# Patient Record
Sex: Male | Born: 1975 | Race: Black or African American | Hispanic: No | Marital: Married | Smoking: Former smoker
Health system: Southern US, Community
[De-identification: ages and names within clinical notes are randomized; demographics above are authoritative.]

---

## 2020-12-11 ENCOUNTER — Other Ambulatory Visit: Payer: Self-pay

## 2020-12-11 ENCOUNTER — Inpatient Hospital Stay: Payer: Self-pay | Attending: Hematology

## 2020-12-11 DIAGNOSIS — Z23 Encounter for immunization: Secondary | ICD-10-CM | POA: Insufficient documentation

## 2020-12-11 NOTE — Progress Notes (Signed)
   Covid-19 Vaccination Clinic  Name:  Gavin Santos    MRN: [id number] DOB: 09-Dec-1975  12/11/2020  [name] was observed post Covid-19 immunization for 15 minutes without incident. He was provided with Vaccine Information Sheet and instruction to access the V-Safe system.   [name] was instructed to call 911 with any severe reactions post vaccine: Marland Kitchen Difficulty breathing  . Swelling of face and throat  . A fast heartbeat  . A bad rash all over body  . Dizziness and weakness   Immunizations Administered    Name Date Dose VIS Date Route   Pfizer COVID-19 Vaccine 12/11/2020  3:10 PM 0.3 mL 09/17/2020 Intramuscular   Manufacturer: [employer], [employer]   Lot: [id number]   NDC: [address]-5

## 2021-04-16 ENCOUNTER — Other Ambulatory Visit: Payer: Self-pay

## 2021-04-16 ENCOUNTER — Other Ambulatory Visit (HOSPITAL_BASED_OUTPATIENT_CLINIC_OR_DEPARTMENT_OTHER): Payer: Self-pay

## 2021-04-16 ENCOUNTER — Encounter (HOSPITAL_COMMUNITY): Payer: Self-pay

## 2021-04-16 ENCOUNTER — Telehealth (HOSPITAL_COMMUNITY): Payer: Self-pay | Admitting: Emergency Medicine

## 2021-04-16 ENCOUNTER — Ambulatory Visit (HOSPITAL_COMMUNITY)
Admission: RE | Admit: 2021-04-16 | Discharge: 2021-04-16 | Disposition: A | Discharge status: Home / Self Care | Payer: No Typology Code available for payment source | Source: Ambulatory Visit | Attending: Internal Medicine | Admitting: Internal Medicine

## 2021-04-16 VITALS — BP 129/91 | HR 78 | Temp 99.8°F | Resp 20

## 2021-04-16 DIAGNOSIS — M5432 Sciatica, left side: Secondary | ICD-10-CM

## 2021-04-16 MED ORDER — METHOCARBAMOL 500 MG PO TABS
500.0000 mg | ORAL_TABLET | Freq: Three times a day (TID) | ORAL | 0 refills | Status: DC | PRN
  Filled 2021-04-16 (×2): qty 30, 10d supply, fill #0

## 2021-04-16 MED ORDER — METHOCARBAMOL 500 MG PO TABS: 500.0000 mg | ORAL_TABLET | Freq: Three times a day (TID) | ORAL | 0 refills | Status: DC | PRN

## 2021-04-16 MED ORDER — KETOROLAC TROMETHAMINE 60 MG/2ML IM SOLN
INTRAMUSCULAR | Status: AC
  Filled 2021-04-16: qty 2

## 2021-04-16 MED ORDER — IBUPROFEN 800 MG PO TABS
800.0000 mg | ORAL_TABLET | Freq: Three times a day (TID) | ORAL | 0 refills | Status: DC | PRN
  Filled 2021-04-16 (×2): qty 30, 10d supply, fill #0

## 2021-04-16 MED ORDER — PREDNISONE 20 MG PO TABS
40.0000 mg | ORAL_TABLET | Freq: Every day | ORAL | 0 refills | Status: AC
  Filled 2021-04-16 (×2): qty 14, 7d supply, fill #0

## 2021-04-16 MED ORDER — PREDNISONE 20 MG PO TABS: 40.0000 mg | ORAL_TABLET | Freq: Every day | ORAL | 0 refills | Status: DC

## 2021-04-16 MED ORDER — KETOROLAC TROMETHAMINE 60 MG/2ML IM SOLN
60.0000 mg | Freq: Once | INTRAMUSCULAR | Status: AC
  Administered 2021-04-16: 60 mg via INTRAMUSCULAR

## 2021-04-16 MED ORDER — IBUPROFEN 800 MG PO TABS: 800.0000 mg | ORAL_TABLET | Freq: Three times a day (TID) | ORAL | 0 refills | Status: DC | PRN

## 2021-04-16 NOTE — Discharge Instructions (Addendum)
Take medication as prescribed.  Do not start the ibuprofen until tomorrow as you received an injection at the clinic today.  The muscle relaxer may make you tired so no driving when taking. Heating pad and light stretches as we discussed.

## 2021-04-16 NOTE — ED Triage Notes (Signed)
Pt in with c/o left side sciatic nerve pain x 2 weeks  Pt states he has been doing stretches with no relief

## 2021-04-16 NOTE — ED Provider Notes (Signed)
[facility]    CSN: [id number] Arrival date & time: 04/16/21  1240      History   Chief Complaint Chief Complaint  Patient presents with  . sciatic nerve pain  . Appointment    HPI Gavin Santos is a 45 y.o. male presents urgent care with complaints of left leg pain x2 weeks.  Patient states pain begins in mid glutes and radiates down back of leg to the knee.  Symptoms similar to previous sciatica flares in the past.  He has tried gabapentin and Aleve with little relief.  He denies any weakness, trauma, injury, numbness or tingling.  Pain exacerbated by sitting for long periods of time.  History reviewed. No pertinent past medical history.  There are no problems to display for this patient.   History reviewed. No pertinent surgical history.     Home Medications    Prior to Admission medications   Medication Sig Start Date End Date Taking? Authorizing Provider  ibuprofen (ADVIL) 800 MG tablet Take 1 tablet (800 mg total) by mouth every 8 (eight) hours as needed for moderate pain. 04/16/21  Yes Rolla Etienne, NP  methocarbamol (ROBAXIN) 500 MG tablet Take 1 tablet (500 mg total) by mouth every 8 (eight) hours as needed for muscle spasms. 04/16/21  Yes Rolla Etienne, NP  predniSONE (DELTASONE) 20 MG tablet Take 2 tablets (40 mg total) by mouth daily with breakfast for 7 days. 04/16/21 04/23/21 Yes Rolla Etienne, NP    Family History History reviewed. No pertinent family history.  Social History Social History   Tobacco Use  . Smoking status: Former Games developer  . Smokeless tobacco: Never Used  Substance Use Topics  . Drug use: Never     Allergies   Mixed ragweed and Penicillins   Review of Systems Stated in HPI otherwise negative  Physical Exam Triage Vital Signs ED Triage Vitals  Enc Vitals Group     BP 04/16/21 1255 (!) 129/91     Pulse Rate 04/16/21 1255 78     Resp 04/16/21 1255 20     Temp 04/16/21 1255 99.8 F (37.7 C)     Temp Source  04/16/21 1255 Oral     SpO2 04/16/21 1255 98 %     Weight --      Height --      Head Circumference --      Peak Flow --      Pain Score 04/16/21 1251 3     Pain Loc --      Pain Edu? --      Excl. in GC? --    No data found.  Updated Vital Signs BP (!) 129/91 (BP Location: Right Arm)   Pulse 78   Temp 99.8 F (37.7 C) (Oral)   Resp 20   SpO2 98%   Visual Acuity Right Eye Distance:   Left Eye Distance:   Bilateral Distance:    Right Eye Near:   Left Eye Near:    Bilateral Near:     Physical Exam Constitutional:      General: He is not in acute distress.    Appearance: Normal appearance. He is not ill-appearing or toxic-appearing.  Musculoskeletal:        General: No deformity. Normal range of motion.  Skin:    General: Skin is warm and dry.  Neurological:     General: No focal deficit present.     Mental Status: He is alert and oriented to  person, place, and time.     Sensory: No sensory deficit.     Motor: No weakness.     Gait: Gait normal.     Comments: Pain elicited with straight leg test  Psychiatric:        Mood and Affect: Mood normal.        Behavior: Behavior normal.      UC Treatments / Results  Labs (all labs ordered are listed, but only abnormal results are displayed) Labs Reviewed - No data to display  EKG   Radiology No results found.  Procedures Procedures (including critical care time)  Medications Ordered in UC Medications  ketorolac (TORADOL) injection 60 mg (has no administration in time range)    Initial Impression / Assessment and Plan / UC Course  I have reviewed the triage vital signs and the nursing notes.  Pertinent labs & imaging results that were available during my care of the patient were reviewed by me and considered in my medical decision making (see chart for details).  Sciatica, left side -Pain is 2 weeks difficulty sleeping and performing job -Short prednisone burst -IM Toradol in office.  To begin  ibuprofen as needed tomorrow, Robaxin as needed, heat, light stretches  Reviewed expections re: course of current medical issues. Questions answered. Outlined signs and symptoms indicating need for more acute intervention. Pt verbalized understanding. AVS given   Final Clinical Impressions(s) / UC Diagnoses   Final diagnoses:  Sciatica of left side     Discharge Instructions     Take medication as prescribed. The muscle relaxer may make you tired so no driving when taking. Heating pad and light stretches as we discussed.     ED Prescriptions    Medication Sig Dispense Auth. Provider   predniSONE (DELTASONE) 20 MG tablet Take 2 tablets (40 mg total) by mouth daily with breakfast for 7 days. 14 tablet Rolla Etienne, NP   ibuprofen (ADVIL) 800 MG tablet Take 1 tablet (800 mg total) by mouth every 8 (eight) hours as needed for moderate pain. 30 tablet Rolla Etienne, NP   methocarbamol (ROBAXIN) 500 MG tablet Take 1 tablet (500 mg total) by mouth every 8 (eight) hours as needed for muscle spasms. 30 tablet Rolla Etienne, NP     PDMP not reviewed this encounter.   Rolla Etienne, NP 04/16/21 1351

## 2021-06-11 ENCOUNTER — Other Ambulatory Visit: Payer: Self-pay

## 2021-06-12 ENCOUNTER — Encounter: Payer: Self-pay | Admitting: Medical

## 2021-06-12 ENCOUNTER — Telehealth: Payer: Self-pay | Admitting: Medical

## 2021-06-12 ENCOUNTER — Ambulatory Visit (HOSPITAL_BASED_OUTPATIENT_CLINIC_OR_DEPARTMENT_OTHER)
Admission: RE | Admit: 2021-06-12 | Discharge: 2021-06-12 | Disposition: A | Discharge status: Home / Self Care | Payer: No Typology Code available for payment source | Source: Ambulatory Visit | Attending: Medical | Admitting: Medical

## 2021-06-12 ENCOUNTER — Ambulatory Visit (INDEPENDENT_AMBULATORY_CARE_PROVIDER_SITE_OTHER): Payer: No Typology Code available for payment source | Admitting: Medical

## 2021-06-12 VITALS — BP 120/80 | HR 62 | Temp 98.4°F | Resp 18 | Ht 74.0 in | Wt 320.0 lb

## 2021-06-12 DIAGNOSIS — G8929 Other chronic pain: Secondary | ICD-10-CM

## 2021-06-12 DIAGNOSIS — Z23 Encounter for immunization: Secondary | ICD-10-CM

## 2021-06-12 DIAGNOSIS — M5442 Lumbago with sciatica, left side: Secondary | ICD-10-CM

## 2021-06-12 DIAGNOSIS — Z1211 Encounter for screening for malignant neoplasm of colon: Secondary | ICD-10-CM | POA: Diagnosis not present

## 2021-06-12 DIAGNOSIS — Z113 Encounter for screening for infections with a predominantly sexual mode of transmission: Secondary | ICD-10-CM

## 2021-06-12 DIAGNOSIS — Z0000 Encounter for general adult medical examination without abnormal findings: Secondary | ICD-10-CM

## 2021-06-12 NOTE — Addendum Note (Signed)
Addended by: Maximino Sarin on: 06/12/2021 03:10 PM   Modules accepted: Orders

## 2021-06-12 NOTE — Patient Instructions (Addendum)
For you wellness exam today I have ordered cbc, cmp, lipid panel and hiv.  Vaccine given today tdap.  Recommend exercise and healthy diet.  We will let you know lab results as they come in.  Follow up date appointment will be determined after lab review.    For back pain/sciatica persisting after 2 month will get xray lumbar spine. After review may refer to sports medicine MD.  Preventive Care 45-45 Years Old, Male Preventive care refers to lifestyle choices and visits with your health care provider that can promote health and wellness. This includes: A yearly physical exam. This is also called an annual wellness visit. Regular dental and eye exams. Immunizations. Screening for certain conditions. Healthy lifestyle choices, such as: Eating a healthy diet. Getting regular exercise. Not using drugs or products that contain nicotine and tobacco. Limiting alcohol use. What can I expect for my preventive care visit? Physical exam Your health care provider may check your: Height and weight. These may be used to calculate your BMI (body mass index). BMI is a measurement that tells if you are at a healthy weight. Heart rate and blood pressure. Body temperature. Skin for abnormal spots. Counseling Your health care provider may ask you questions about your: Past medical problems. Family's medical history. Alcohol, tobacco, and drug use. Emotional well-being. Home life and relationship well-being. Sexual activity. Diet, exercise, and sleep habits. Work and work Astronomer. Access to firearms. What immunizations do I need?  Vaccines are usually given at various ages, according to a schedule. Your health care provider will recommend vaccines for you based on your age, medicalhistory, and lifestyle or other factors, such as travel or where you work. What tests do I need? Blood tests Lipid and cholesterol levels. These may be checked every 5 years starting at age 76. Hepatitis C  test. Hepatitis B test. Screening  Diabetes screening. This is done by checking your blood sugar (glucose) after you have not eaten for a while (fasting). Genital exam to check for testicular cancer or hernias. STD (sexually transmitted disease) testing, if you are at risk. Talk with your health care provider about your test results, treatment options,and if necessary, the need for more tests. Follow these instructions at home: Eating and drinking  Eat a healthy diet that includes fresh fruits and vegetables, whole grains, lean protein, and low-fat dairy products. Drink enough fluid to keep your urine pale yellow. Take vitamin and mineral supplements as recommended by your health care provider. Do not drink alcohol if your health care provider tells you not to drink. If you drink alcohol: Limit how much you have to 0-2 drinks a day. Be aware of how much alcohol is in your drink. In the [place] one drink equals one 12 oz bottle of beer (355 mL), one 5 oz glass of wine (148 mL), or one 1 oz glass of hard liquor (44 mL).  Lifestyle Take daily care of your teeth and gums. Brush your teeth every morning and night with fluoride toothpaste. Floss one time each day. Stay active. Exercise for at least 30 minutes 5 or more days each week. Do not use any products that contain nicotine or tobacco, such as cigarettes, e-cigarettes, and chewing tobacco. If you need help quitting, ask your health care provider. Do not use drugs. If you are sexually active, practice safe sex. Use a condom or other form of protection to prevent STIs (sexually transmitted infections). Find healthy ways to cope with stress, such as: Meditation, yoga, or listening  to music. Journaling. Talking to a trusted person. Spending time with friends and family. Safety Always wear your seat belt while driving or riding in a vehicle. Do not drive: If you have been drinking alcohol. Do not ride with someone who has been  drinking. When you are tired or distracted. While texting. Wear a helmet and other protective equipment during sports activities. If you have firearms in your house, make sure you follow all gun safety procedures. Seek help if you have been physically or sexually abused. What's next? Go to your health care provider once a year for an annual wellness visit. Ask your health care provider how often you should have your eyes and teeth checked. Stay up to date on all vaccines. This information is not intended to replace advice given to you by your health care provider. Make sure you discuss any questions you have with your healthcare provider. Document Revised: 08/01/2019 Document Reviewed: 11/09/2018 Elsevier Patient Education  2022 [employer].

## 2021-06-12 NOTE — Telephone Encounter (Signed)
Sport med referral placed

## 2021-06-12 NOTE — Progress Notes (Signed)
Subjective:    Patient ID: Gavin Santos, male    DOB: 04-Nov-1976, 45 y.o.   MRN: [id number]  HPI  Pt in for first time.  Decide to go ahead and do wellness exam since new pt and not complicated.  Pt up to date on covid vaccine. Had one j and j vaccine and got phizer as booster.   Needs tetanus not up to date.  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 Pt does not exercise reguarly. States diet is ok. Eats fast food 2-3 times a week. Non smoker. Alcohol use. Sporadic use. Every 3 months 3-4 shots tequilla or bourbon. Pepsi 2-3 cans a day. 1 cup in morning.  No known chronic medical problems. More than 3 years since last cpe.  Pt mentions some recent sciatica.   May 19,2022 seen in the ED.  Hpi in [name] is a 45 y.o. male presents urgent care with complaints of left leg pain x2 weeks.  Patient states pain begins in mid glutes and radiates down back of leg to the knee.  Symptoms similar to previous sciatica flares in the past.  He has tried gabapentin and Aleve with little relief.  He denies any weakness, trauma, injury, numbness or tingling.  Pain exacerbated by sitting for long periods of time  A/P in ED.  "-Pain is 2 weeks difficulty sleeping and performing job -Short prednisone burst -IM Toradol in office.  To begin ibuprofen as needed tomorrow, Robaxin as needed, heat, light stretches   Reviewed expections re: course of current medical issues. Questions answered. Outlined signs and symptoms indicating need for more acute intervention. Pt verbalized understanding. AVS given"  No pain is present only at the end of the day. But still present faintly. Not on any med presently. Now pain level 6/10 after work. If go home and sits pain will dissipate but gradualy.   No xray done in the UC.    Review of Systems  Constitutional:  Negative for chills, fatigue and fever.  HENT:  Negative for dental problem, ear pain, mouth sores and nosebleeds.    Respiratory:  Negative for cough, chest tightness, wheezing and stridor.   Cardiovascular:  Negative for chest pain and palpitations.  Gastrointestinal:  Negative for abdominal pain.  Genitourinary:  Negative for dysuria and enuresis.  Musculoskeletal:  Negative for back pain.  Skin:  Negative for rash.  Neurological:  Negative for dizziness, syncope, light-headedness and headaches.  Hematological:  Negative for adenopathy. Does not bruise/bleed easily.  Psychiatric/Behavioral:  Negative for confusion.    History reviewed. No pertinent past medical history.   Social History   Socioeconomic History   Marital status: Married    Spouse name: Not on file   Number of children: Not on file   Years of education: Not on file   Highest education level: Not on file  Occupational History   Not on file  Tobacco Use   Smoking status: Former   Smokeless tobacco: Never  Vaping Use   Vaping Use: Never used  Substance and Sexual Activity   Alcohol use: Yes    Alcohol/week: 3.0 standard drinks    Types: 3 Shots of liquor per week    Comment: sporatic use about every 3 months.   Drug use: Never   Sexual activity: Yes  Other Topics Concern   Not on file  Social History Narrative   Not on file   Social Determinants of Health   Financial Resource Strain: Not on  file  Food Insecurity: Not on file  Transportation Needs: Not on file  Physical Activity: Not on file  Stress: Not on file  Social Connections: Not on file  Intimate Partner Violence: Not on file    History reviewed. No pertinent surgical history.  History reviewed. No pertinent family history.  Allergies  Allergen Reactions   Mixed Ragweed    Penicillins     Current Outpatient Medications on File Prior to Visit  Medication Sig Dispense Refill   ibuprofen (ADVIL) 800 MG tablet Take 1 tablet (800 mg total) by mouth every 8 (eight) hours as needed for moderate pain. 30 tablet 0   methocarbamol (ROBAXIN) 500 MG tablet  Take 1 tablet (500 mg total) by mouth every 8 (eight) hours as needed for muscle spasms. 30 tablet 0   No current facility-administered medications on file prior to visit.    Pulse 62   Temp 98.4 F (36.9 C)   Resp 18   Ht 6\' 2"  (1.88 m)   SpO2 98%         Objective:   Physical Exam  General Appearance- Not in acute distress.    Chest and Lung Exam Auscultation: Breath sounds:-Normal. Clear even and unlabored. Adventitious sounds:- No Adventitious sounds.  Cardiovascular Auscultation:Rythm - Regular, rate and rythm. Heart Sounds -Normal heart sounds.  Abdomen Inspection:-Inspection Normal.  Palpation/Perucssion: Palpation and Percussion of the abdomen reveal- Non Tender, No Rebound tenderness, No rigidity(Guarding) and No Palpable abdominal masses.  Liver:-Normal.  Spleen:- Normal.   Back Mid lumbar spine tenderness to palpation. Pain on straight leg lift. Pain on lateral movements and flexion/extension of the spine.  Lower ext neurologic  L5-S1 sensation intact bilaterally. Normal patellar reflexes bilaterally. No foot drop bilaterally.       Assessment & Plan:   For you wellness exam today I have ordered cbc, cmp, lipid panel and hiv.  Vaccine given today tdap.  Recommend exercise and healthy diet.  We will let you know lab results as they come in.  Follow up date appointment will be determined after lab review.    For back pain/sciatica persisting after 2 month will get xray lumbar spine. After review may refer to sports medicine MD.  , PA-C

## 2021-06-15 LAB — LIPID PANEL
Cholesterol: 183 mg/dL (ref <200)
HDL: 43 mg/dL (ref >=40)
LDL Cholesterol (Calc): 119 mg/dL (calc) — ABNORMAL HIGH
Non-HDL Cholesterol (Calc): 140 mg/dL (calc) — ABNORMAL HIGH (ref <130)
Total CHOL/HDL Ratio: 4.3 (calc) (ref <5.0)
Triglycerides: 99 mg/dL (ref <150)

## 2021-06-15 LAB — CBC WITH DIFFERENTIAL/PLATELET
Absolute Monocytes: 312 cells/uL (ref 200–950)
Basophils Absolute: 49 cells/uL (ref 0–200)
Basophils Relative: 1.3 %
Eosinophils Absolute: 30 cells/uL (ref 15–500)
Eosinophils Relative: 0.8 %
HCT: 46 % (ref 38.5–50.0)
Hemoglobin: 15.1 g/dL (ref 13.2–17.1)
Lymphs Abs: 1710 cells/uL (ref 850–3900)
MCH: 27.9 pg (ref 27.0–33.0)
MCHC: 32.8 g/dL (ref 32.0–36.0)
MCV: 85 fL (ref 80.0–100.0)
MPV: 10.2 fL (ref 7.5–12.5)
Monocytes Relative: 8.2 %
Neutro Abs: 1699 cells/uL (ref 1500–7800)
Neutrophils Relative %: 44.7 %
Platelets: 238 10*3/uL (ref 140–400)
RBC: 5.41 10*6/uL (ref 4.20–5.80)
RDW: 13.1 % (ref 11.0–15.0)
Total Lymphocyte: 45 %
WBC: 3.8 10*3/uL (ref 3.8–10.8)

## 2021-06-15 LAB — COMPREHENSIVE METABOLIC PANEL
AG Ratio: 1.6 (calc) (ref 1.0–2.5)
ALT: 15 U/L (ref 9–46)
AST: 19 U/L (ref 10–40)
Albumin: 4.3 g/dL (ref 3.6–5.1)
Alkaline phosphatase (APISO): 45 U/L (ref 36–130)
BUN: 14 mg/dL (ref 7–25)
CO2: 24 mmol/L (ref 20–32)
Calcium: 9.4 mg/dL (ref 8.6–10.3)
Chloride: 103 mmol/L (ref 98–110)
Creat: 1.19 mg/dL (ref 0.60–1.29)
Globulin: 2.7 g/dL (calc) (ref 1.9–3.7)
Glucose, Bld: 87 mg/dL (ref 65–99)
Potassium: 4.4 mmol/L (ref 3.5–5.3)
Sodium: 139 mmol/L (ref 135–146)
Total Bilirubin: 0.6 mg/dL (ref 0.2–1.2)
Total Protein: 7 g/dL (ref 6.1–8.1)

## 2021-06-15 LAB — HIV ANTIBODY (ROUTINE TESTING W REFLEX): HIV 1&2 Ab, 4th Generation: NONREACTIVE

## 2021-06-26 ENCOUNTER — Ambulatory Visit (HOSPITAL_BASED_OUTPATIENT_CLINIC_OR_DEPARTMENT_OTHER)
Admission: RE | Admit: 2021-06-26 | Discharge: 2021-06-26 | Disposition: A | Discharge status: Home / Self Care | Payer: No Typology Code available for payment source | Source: Ambulatory Visit | Attending: Family Medicine | Admitting: Family Medicine

## 2021-06-26 ENCOUNTER — Other Ambulatory Visit: Payer: Self-pay

## 2021-06-26 ENCOUNTER — Ambulatory Visit: Payer: Self-pay

## 2021-06-26 ENCOUNTER — Other Ambulatory Visit (HOSPITAL_BASED_OUTPATIENT_CLINIC_OR_DEPARTMENT_OTHER): Payer: Self-pay

## 2021-06-26 ENCOUNTER — Ambulatory Visit (INDEPENDENT_AMBULATORY_CARE_PROVIDER_SITE_OTHER): Payer: No Typology Code available for payment source | Admitting: Family Medicine

## 2021-06-26 ENCOUNTER — Encounter: Payer: Self-pay | Admitting: Family Medicine

## 2021-06-26 VITALS — BP 140/108 | Ht 74.0 in | Wt 320.0 lb

## 2021-06-26 DIAGNOSIS — M659 Synovitis and tenosynovitis, unspecified: Secondary | ICD-10-CM | POA: Diagnosis not present

## 2021-06-26 DIAGNOSIS — M461 Sacroiliitis, not elsewhere classified: Secondary | ICD-10-CM | POA: Insufficient documentation

## 2021-06-26 DIAGNOSIS — M79675 Pain in left toe(s): Secondary | ICD-10-CM

## 2021-06-26 MED ORDER — CELECOXIB 200 MG PO CAPS
ORAL_CAPSULE | ORAL | 2 refills | Status: AC
  Filled 2021-06-26: qty 60, 30d supply, fill #0
  Filled 2021-07-30: qty 60, 30d supply, fill #1
  Filled 2021-09-27: qty 60, 30d supply, fill #2

## 2021-06-26 NOTE — Patient Instructions (Signed)
Nice to meet you Please try heat Please try the medicine as needed  I will call with the results from today   Please send me a message in MyChart with any questions or updates.  Please see me back in 4 weeks.   --Dr. Jordan Likes

## 2021-06-26 NOTE — Assessment & Plan Note (Signed)
Having acute on chronic low back pain with radicular type pain.  Has changes in the SI joints with could represent ankylosing spondylitis. -Counseled on home exercise therapy and supportive care. -Celebrex. -ANA panel, CRP, sed rate, HLA-B27

## 2021-06-26 NOTE — Assessment & Plan Note (Signed)
Does have degenerative changes of the joint and suspicion for gouty source. -Counseled on home exercise therapy and supportive care. - Uric acid. -Could consider Green sport insoles with first ray post

## 2021-06-26 NOTE — Progress Notes (Signed)
  Gavin Santos - 45 y.o. male MRN [id number]  Date of birth: 01-30-1976  SUBJECTIVE:  Including CC & ROS.  No chief complaint on file.   Gavin Santos is a 45 y.o. male that is  presenting with low back pain and left great toe pain. Having low back pain and radicular symptoms down the left leg. No injury or incident. Symptoms seem worse at the end of the day. Having left great toe pain that started a few days ago. No history of gout.   Independent review of the lumbar spine x-ray from 7/15 shows loss of lordosis and mild facet arthrosis.  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   Review of Systems See HPI   HISTORY: Past Medical, Surgical, Social, and Family History Reviewed & Updated per EMR.   Pertinent Historical Findings include:  History reviewed. No pertinent past medical history.  History reviewed. No pertinent surgical history.  History reviewed. No pertinent family history.  Social History   Socioeconomic History   Marital status: Married    Spouse name: Not on file   Number of children: Not on file   Years of education: Not on file   Highest education level: Not on file  Occupational History   Not on file  Tobacco Use   Smoking status: Former   Smokeless tobacco: Never  Vaping Use   Vaping Use: Never used  Substance and Sexual Activity   Alcohol use: Yes    Alcohol/week: 3.0 standard drinks    Types: 3 Shots of liquor per week    Comment: sporatic use about every 3 months.   Drug use: Never   Sexual activity: Yes  Other Topics Concern   Not on file  Social History Narrative   Not on file   Social Determinants of Health   Financial Resource Strain: Not on file  Food Insecurity: Not on file  Transportation Needs: Not on file  Physical Activity: Not on file  Stress: Not on file  Social Connections: Not on file  Intimate Partner Violence: Not on file     PHYSICAL EXAM:  VS: BP (!) 140/108 (BP Location: Left Arm,  Patient Position: Sitting, Cuff Size: Large)   Ht $R'6\' 2"'WC$  (1.88 m)   Wt (!) 320 lb (145.2 kg)   BMI 41.09 kg/m  Physical Exam Gen: NAD, alert, cooperative with exam, well-appearing MSK:  Left great toe:  Swelling and no bruising.  Limited range of motion  No warmth and redness  Neurovascularly intact    Limited ultrasound: left great toe:  Degenerative changes of the joint with mild effusion.  Mild hyperemia appreciated and around the synovium. Hyperechoic changes could represent a gouty source  Summary: Degenerative changes of the joint  Ultrasound and interpretation by Clearance Coots, MD    ASSESSMENT & PLAN:   Sacroiliitis ([place]) Having acute on chronic low back pain with radicular type pain.  Has changes in the SI joints with could represent ankylosing spondylitis. -Counseled on home exercise therapy and supportive care. -Celebrex. -ANA panel, CRP, sed rate, HLA-B27  Synovitis of toe Does have degenerative changes of the joint and suspicion for gouty source. -Counseled on home exercise therapy and supportive care. - Uric acid. -Could consider Green sport insoles with first ray post

## 2021-07-01 ENCOUNTER — Telehealth: Payer: Self-pay | Admitting: Family Medicine

## 2021-07-01 NOTE — Telephone Encounter (Signed)
Informed of results.   Myra Rude, MD [facility] 07/01/2021, 8:03 AM

## 2021-07-08 LAB — ANA,IFA RA DIAG PNL W/RFLX TIT/PATN
ANA Titer 1: NEGATIVE
Cyclic Citrullin Peptide Ab: 8 units (ref 0–19)
Rheumatoid fact SerPl-aCnc: <10 IU/mL (ref <14.0)

## 2021-07-08 LAB — URIC ACID: Uric Acid: 8.5 mg/dL — ABNORMAL HIGH (ref 3.8–8.4)

## 2021-07-08 LAB — HLA-B27 ANTIGEN: HLA B27: NEGATIVE

## 2021-07-08 LAB — SEDIMENTATION RATE: Sed Rate: 6 mm/hr (ref 0–15)

## 2021-07-08 LAB — C-REACTIVE PROTEIN: CRP: 10 mg/L (ref 0–10)

## 2021-07-13 ENCOUNTER — Telehealth: Payer: Self-pay | Admitting: Family Medicine

## 2021-07-13 NOTE — Telephone Encounter (Signed)
Informed of results.   Myra Rude, MD [facility] 07/13/2021, 9:23 AM

## 2021-07-30 ENCOUNTER — Other Ambulatory Visit (HOSPITAL_BASED_OUTPATIENT_CLINIC_OR_DEPARTMENT_OTHER): Payer: Self-pay

## 2021-07-31 ENCOUNTER — Other Ambulatory Visit: Payer: Self-pay

## 2021-07-31 ENCOUNTER — Other Ambulatory Visit (HOSPITAL_BASED_OUTPATIENT_CLINIC_OR_DEPARTMENT_OTHER): Payer: Self-pay

## 2021-07-31 ENCOUNTER — Encounter: Payer: Self-pay | Admitting: Family Medicine

## 2021-07-31 ENCOUNTER — Telehealth (INDEPENDENT_AMBULATORY_CARE_PROVIDER_SITE_OTHER): Payer: No Typology Code available for payment source | Admitting: Family Medicine

## 2021-07-31 VITALS — Ht 74.0 in | Wt 320.0 lb

## 2021-07-31 DIAGNOSIS — M10072 Idiopathic gout, left ankle and foot: Secondary | ICD-10-CM | POA: Diagnosis not present

## 2021-07-31 DIAGNOSIS — M461 Sacroiliitis, not elsewhere classified: Secondary | ICD-10-CM | POA: Diagnosis not present

## 2021-07-31 MED ORDER — PREDNISONE 20 MG PO TABS
ORAL_TABLET | ORAL | 0 refills | Status: DC
  Filled 2021-07-31: qty 18, 10d supply, fill #0

## 2021-07-31 MED ORDER — METHOCARBAMOL 500 MG PO TABS
500.0000 mg | ORAL_TABLET | Freq: Three times a day (TID) | ORAL | 1 refills | Status: AC | PRN
  Filled 2021-07-31: qty 45, 15d supply, fill #0
  Filled 2021-09-27: qty 45, 15d supply, fill #1

## 2021-07-31 MED ORDER — ALLOPURINOL 100 MG PO TABS
100.0000 mg | ORAL_TABLET | Freq: Every day | ORAL | 1 refills | Status: AC
  Filled 2021-07-31: qty 30, 30d supply, fill #0
  Filled 2021-09-27: qty 30, 30d supply, fill #1

## 2021-07-31 NOTE — Assessment & Plan Note (Signed)
Inflammatory changes found on ultrasound and uric acid was found to be elevated. -Counseled on home exercise therapy and supportive care. -Prednisone -Allopurinol. -Could consider injection.

## 2021-07-31 NOTE — Progress Notes (Signed)
Virtual Visit via Video Note  I connected with Gavin Santos on 07/31/21 at  9:10 AM EDT by a video enabled telemedicine application and verified that I am speaking with the correct person using two identifiers.  Location: Patient: home Provider: office   I discussed the limitations of evaluation and management by telemedicine and the availability of in person appointments. The patient expressed understanding and agreed to proceed.  History of Present Illness:  Gavin Santos is a 45-year-old male who is following up for pain.  Has gotten improvement but still notices the pain from time to time.  Uric acid was found to be elevated.   Observations/Objective:   Assessment and Plan:  Gout involving the left toe: Inflammatory changes found on ultrasound and uric acid was found to be elevated. -Counseled on home exercise therapy and supportive care. -Prednisone -Allopurinol. -Could consider injection.  Sacroiliitis: Lab work was revealing for possible gouty origin and some of his pain.  May just be irritation of the SI joints. -Counseled on home exercise therapy and supportive care. -Could consider SI joint injections.  Follow Up Instructions:    I discussed the assessment and treatment plan with the patient. The patient was provided an opportunity to ask questions and all were answered. The patient agreed with the plan and demonstrated an understanding of the instructions.   The patient was advised to call back or seek an in-person evaluation if the symptoms worsen or if the condition fails to improve as anticipated.    Clare Gandy, MD

## 2021-07-31 NOTE — Assessment & Plan Note (Signed)
Lab work was revealing for possible gouty origin and some of his pain.  May just be irritation of the SI joints. -Counseled on home exercise therapy and supportive care. -Could consider SI joint injections.

## 2021-09-11 ENCOUNTER — Encounter: Payer: No Typology Code available for payment source | Admitting: Gastroenterology

## 2021-09-28 ENCOUNTER — Other Ambulatory Visit (HOSPITAL_BASED_OUTPATIENT_CLINIC_OR_DEPARTMENT_OTHER): Payer: Self-pay

## 2022-01-13 IMAGING — DX DG LUMBAR SPINE 2-3V
3 series · 3 of 3 positions shown · non-contrast
Comparison: None.

CLINICAL DATA: Back pain with left-sided sciatica.

EXAM:
LUMBAR SPINE - 2-3 VIEW

[l-spine ap]
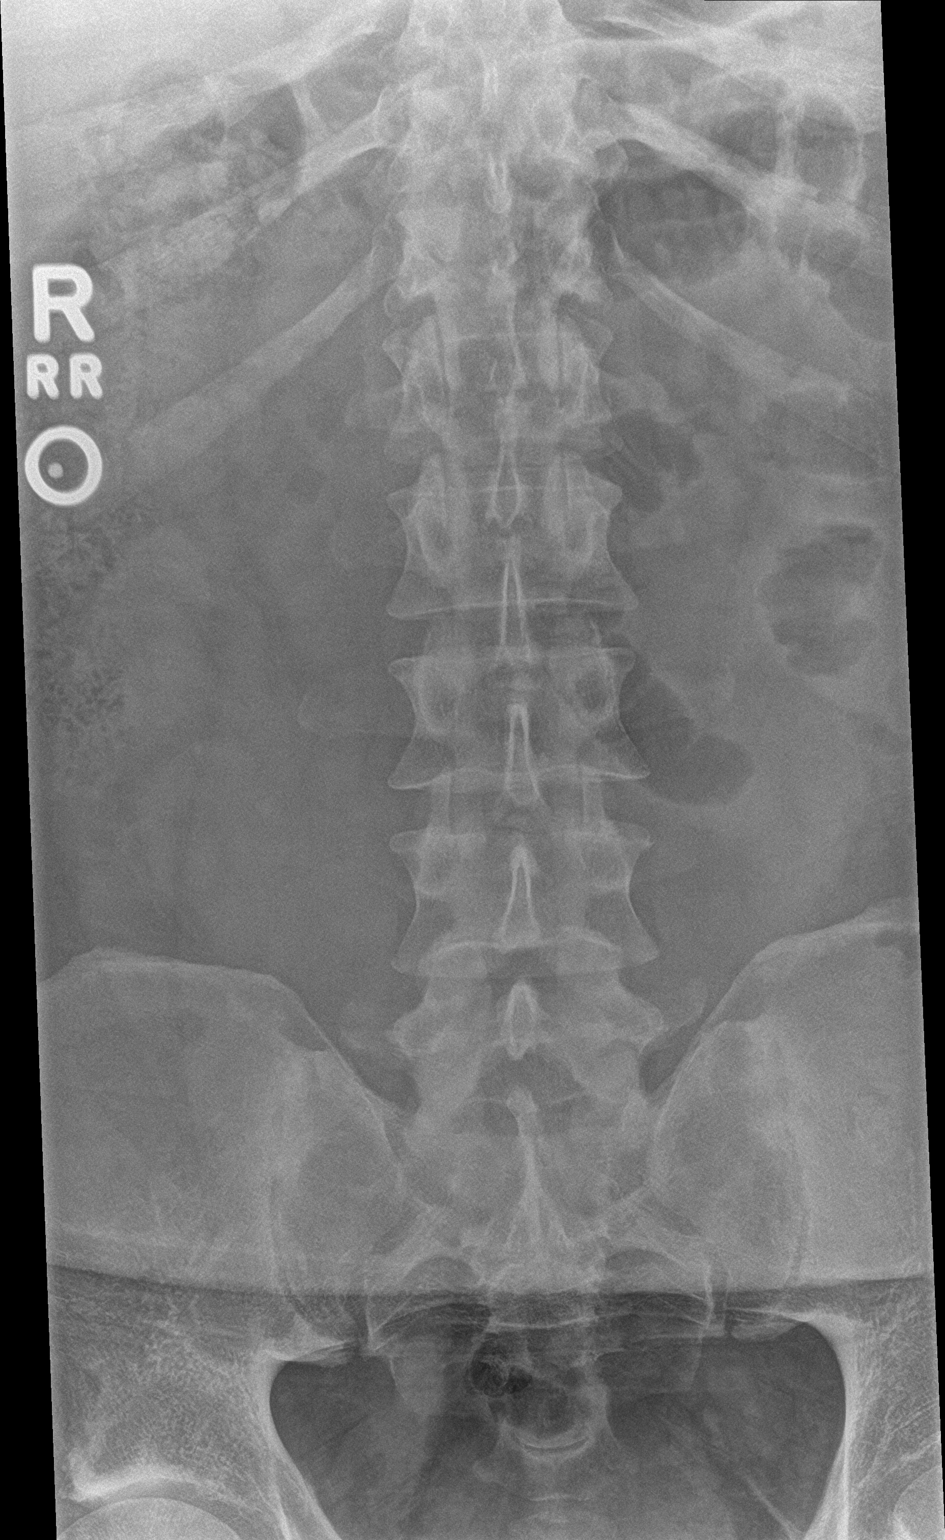

[l-spine lat]
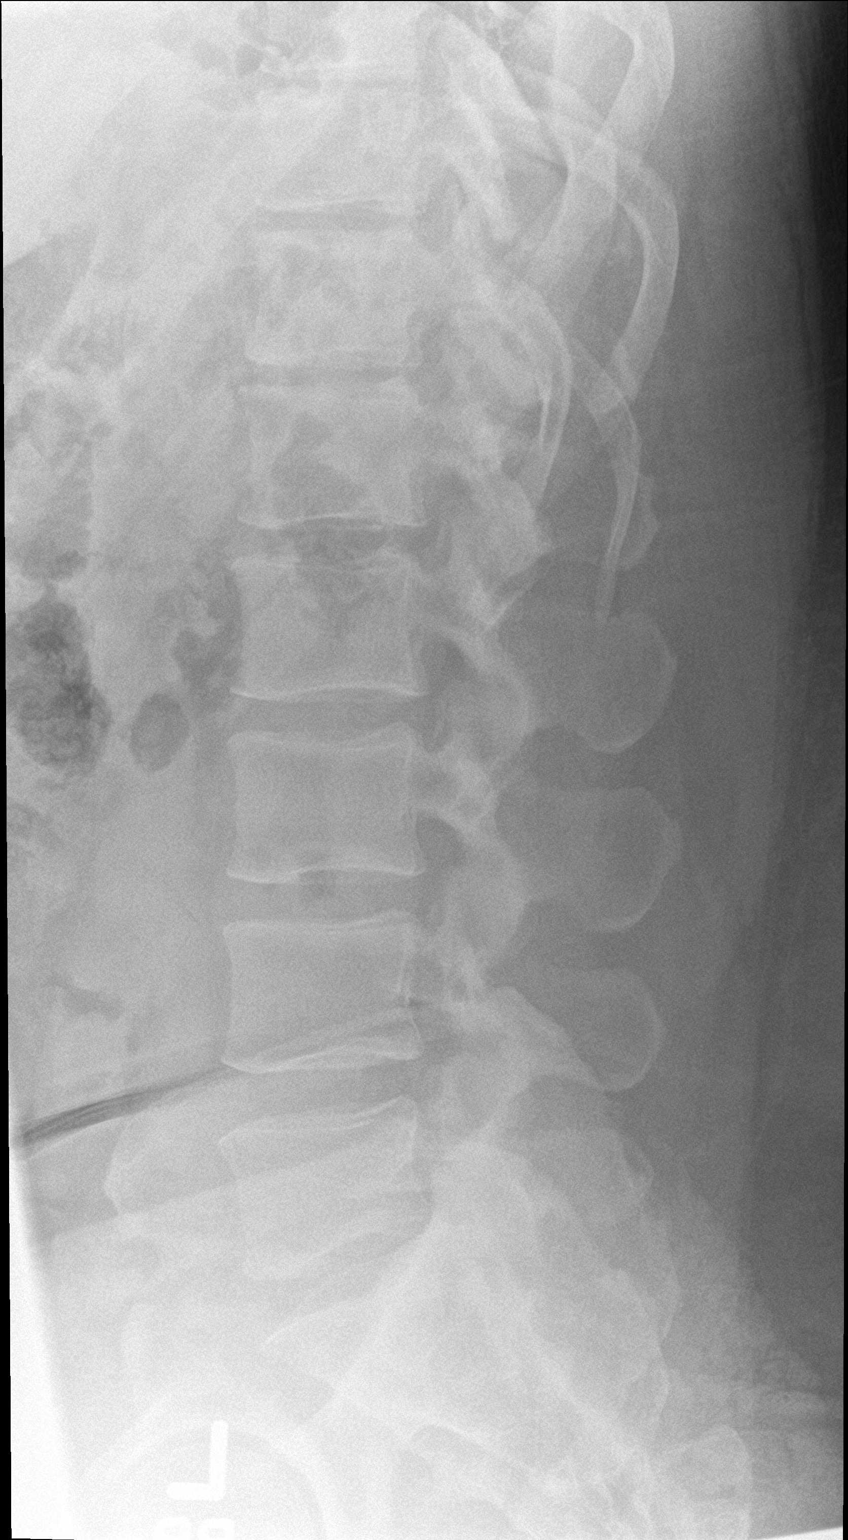

[l-spine spot]
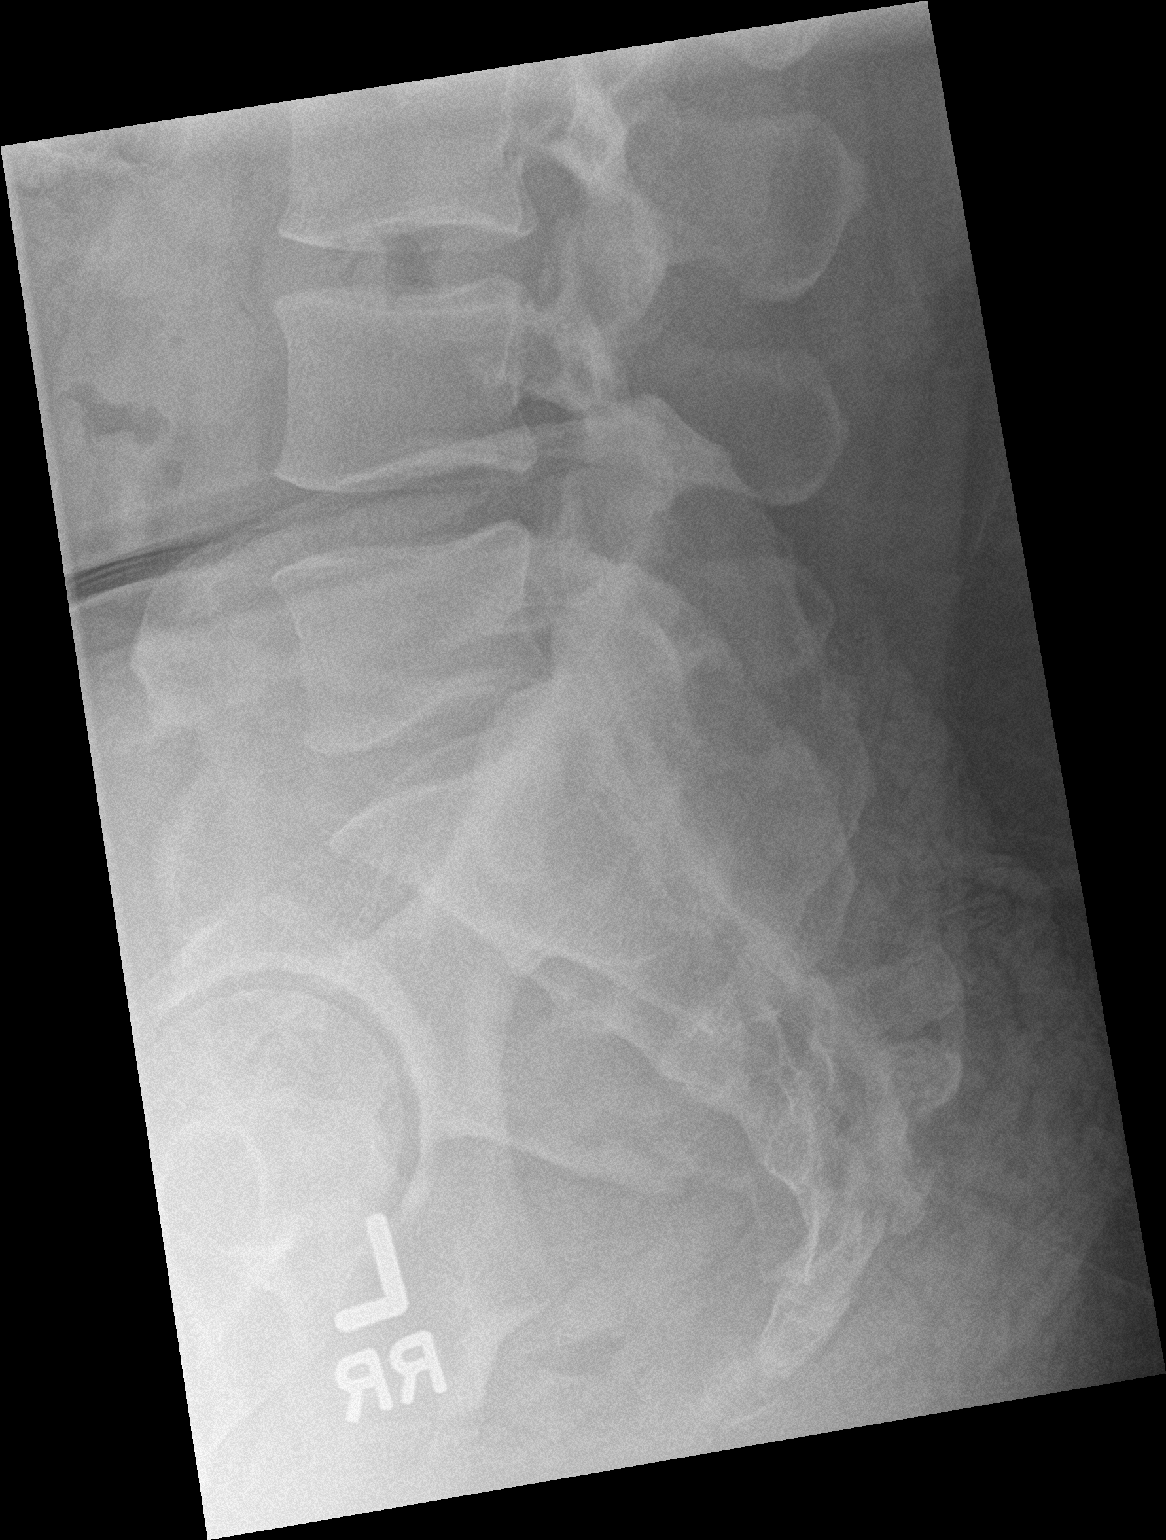

[3 of 3 positions shown; findings below may reference images not displayed]

FINDINGS: Five non rib-bearing lumbar vertebral bodies. No radiographic
evidence of acute fracture. Vertebral body heights are maintained.
Straightening of the normal lumbar lordosis without substantial
sagittal subluxation. High-riding iliac bones, limiting evaluation
of L5 posteriorly. Intervertebral disc heights are maintained. Mild
lower lumbar facet arthropathy. Sclerosis of bilateral sacroiliac
joints superiorly with suspected partial ankylosis.
IMPRESSION: 1. No evidence of acute fracture or malalignment.
2. Intervertebral disc spaces are maintained. Mild lower lumbar
facet arthropathy.
3. Sclerosis and partial ankylosis of bilateral posterior sacroiliac
joints, which may reflect the sequela of sacroiliitis.

## 2022-05-01 ENCOUNTER — Other Ambulatory Visit: Payer: Self-pay

## 2022-05-01 ENCOUNTER — Encounter (HOSPITAL_BASED_OUTPATIENT_CLINIC_OR_DEPARTMENT_OTHER): Payer: Self-pay | Admitting: Emergency Medicine

## 2022-05-01 ENCOUNTER — Emergency Department (HOSPITAL_BASED_OUTPATIENT_CLINIC_OR_DEPARTMENT_OTHER)
Admission: EM | Admit: 2022-05-01 | Discharge: 2022-05-01 | Disposition: A | Discharge status: Home / Self Care | Payer: 59 | Attending: Emergency Medicine | Admitting: Emergency Medicine

## 2022-05-01 DIAGNOSIS — M109 Gout, unspecified: Secondary | ICD-10-CM | POA: Insufficient documentation

## 2022-05-01 MED ORDER — KETOROLAC TROMETHAMINE 15 MG/ML IJ SOLN
30.0000 mg | Freq: Once | INTRAMUSCULAR | Status: AC
  Administered 2022-05-01: 30 mg via INTRAMUSCULAR
  Filled 2022-05-01: qty 2

## 2022-05-01 MED ORDER — PREDNISONE 20 MG PO TABS: ORAL_TABLET | ORAL | 0 refills | Status: AC

## 2022-05-01 NOTE — ED Triage Notes (Signed)
Pt reports L great toe pain. Family history of gout and has had similar symptoms in R great toe as well.

## 2022-05-01 NOTE — ED Provider Notes (Signed)
[facility] EMERGENCY DEPARTMENT Provider Note   CSN: [id number] Arrival date & time: 05/01/22  0950     History  Chief Complaint  Patient presents with   Toe Pain    Gavin Santos is a 46 y.o. male.  Patient is a 46 year old male who presents with pain in his left toe.  He is concerned it may be gout.  He had a similar flareup in his right toe about 3 months ago and was treated for possible gout.  He has a strong family history of gout.  He has had pain for the last few days in his left toe.  He denies any injury.  He has had some swelling of the area.  No fevers.  He was previously given a prescription for allopurinol but only took it for a month and did not have any refills.  He has been using ibuprofen for symptomatic relief without improvement in symptoms.      Home Medications Prior to Admission medications   Medication Sig Start Date End Date Taking? Authorizing Provider  allopurinol (ZYLOPRIM) 100 MG tablet Take 1 tablet (100 mg total) by mouth daily. 07/31/21  Yes Myra Rude, MD  predniSONE (DELTASONE) 20 MG tablet 3 tabs po day one, then 2 po daily x 4 days 05/01/22  Yes Rolan Bucco, MD  celecoxib (CELEBREX) 200 MG capsule Take 1 to 2 tablets by mouth daily as needed for pain. 06/26/21   Myra Rude, MD  methocarbamol (ROBAXIN) 500 MG tablet Take 1 tablet (500 mg total) by mouth every 8 (eight) hours as needed for muscle spasms. 07/31/21   Myra Rude, MD      Allergies    Mixed ragweed and Penicillins    Review of Systems   Review of Systems  Constitutional:  Negative for fever.  Gastrointestinal:  Negative for nausea and vomiting.  Musculoskeletal:  Positive for arthralgias and joint swelling. Negative for back pain and neck pain.  Skin:  Negative for wound.  Neurological:  Negative for weakness, numbness and headaches.   Physical Exam Updated Vital Signs BP (!) 162/115   Pulse 61   Temp 99.2 F (37.3 C) (Oral)   Resp 16   SpO2 99%   Physical Exam Constitutional:      Appearance: He is well-developed.  HENT:     Head: Normocephalic and atraumatic.  Cardiovascular:     Rate and Rhythm: Normal rate.  Pulmonary:     Effort: Pulmonary effort is normal.  Musculoskeletal:        General: Tenderness present.     Cervical back: Normal range of motion and neck supple.     Comments: Positive tenderness and swelling at the base of the left big toe.  There is some warmth and erythema.  It is localized to the MTP joint.  There is no swelling or redness to the remainder of the foot.  Pedal pulses are intact.  He has normal sensation and motor function distally.  Skin:    General: Skin is warm and dry.  Neurological:     Mental Status: He is alert and oriented to person, place, and time.    ED Results / Procedures / Treatments   Labs (all labs ordered are listed, but only abnormal results are displayed) Labs Reviewed - No data to display  EKG None  Radiology No results found.  Procedures Procedures    Medications Ordered in ED Medications  ketorolac (TORADOL) 15 MG/ML injection 30 mg (  has no administration in time range)    ED Course/ Medical Decision Making/ A&P                           Medical Decision Making Risk Prescription drug management.   Patient presents with pain and swelling in his right big toe.  His symptoms are consistent with gout.  There is no fever or other symptoms that would be more concerning for cellulitis or septic arthritis.  He has not had any injuries to the area.  I do not think that x-rays are indicated currently.  He was given an a injection of Toradol.  He will be given a prescription for 5-day course of prednisone.  I advised him to limit NSAID use while he is on the prednisone.  We will continue to take Tylenol.  I discussed following up with his PCP for better long-term management of potential gout.  He can have his blood pressure rechecked at that time.  Return precautions were  given.  I also discussed diet management for gout control.  Final Clinical Impression(s) / ED Diagnoses Final diagnoses:  Acute gout involving toe of left foot, unspecified cause    Rx / DC Orders ED Discharge Orders          Ordered    predniSONE (DELTASONE) 20 MG tablet        05/01/22 1024              Rolan Bucco, MD 05/01/22 1029

## 2022-05-14 ENCOUNTER — Ambulatory Visit (INDEPENDENT_AMBULATORY_CARE_PROVIDER_SITE_OTHER): Payer: 59 | Admitting: Medical

## 2022-05-14 ENCOUNTER — Encounter: Payer: Self-pay | Admitting: Medical

## 2022-05-14 VITALS — BP 130/85 | HR 63 | Temp 98.5°F | Resp 18 | Ht 74.0 in | Wt 333.8 lb

## 2022-05-14 DIAGNOSIS — R030 Elevated blood-pressure reading, without diagnosis of hypertension: Secondary | ICD-10-CM | POA: Diagnosis not present

## 2022-05-14 DIAGNOSIS — Z0000 Encounter for general adult medical examination without abnormal findings: Secondary | ICD-10-CM | POA: Diagnosis not present

## 2022-05-14 DIAGNOSIS — Z1211 Encounter for screening for malignant neoplasm of colon: Secondary | ICD-10-CM

## 2022-05-14 DIAGNOSIS — I10 Essential (primary) hypertension: Secondary | ICD-10-CM | POA: Diagnosis not present

## 2022-05-14 DIAGNOSIS — Z6841 Body Mass Index (BMI) 40.0 and over, adult: Secondary | ICD-10-CM

## 2022-05-14 DIAGNOSIS — M10072 Idiopathic gout, left ankle and foot: Secondary | ICD-10-CM

## 2022-05-14 LAB — URIC ACID: Uric Acid, Serum: 7.7 mg/dL (ref 4.0–7.8)

## 2022-05-14 MED ORDER — COLCHICINE 0.6 MG PO TABS: 0.6000 mg | ORAL_TABLET | Freq: Two times a day (BID) | ORAL | 0 refills | Status: AC

## 2022-05-14 NOTE — Progress Notes (Signed)
Subjective:    Patient ID: Gavin Santos, male    DOB: May 10, 1976, 46 y.o.   MRN: [id number]  HPI     Pt in for follow up for toe pain. Seen in the emergency department.  Decided to go ahead and do wellness exam. He got here early fasting and time permitted.  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 Pt does not exercise reguarly. States diet is ok. Eats fast food 1 time a week. Non smoker. Alcohol use. Sporadic use. Every 3 months 3-4 shots tequilla or bourbon(but decreasing since aware can cause gout flare). Pepsi 2-3 cans a day. 1 cup in morning.  Pt due to colonoscopy.  No family history of prostate cancer.   Hep C screening not indicated.  2 covid vaccines.  Below in "  ED visit note.  "Gavin Santos is a 46 y.o. male.   Patient is a 46 year old male who presents with pain in his left toe.  He is concerned it may be gout.  He had a similar flareup in his right toe about 3 months ago and was treated for possible gout.  He has a strong family history of gout.  He has had pain for the last few days in his left toe.  He denies any injury.  He has had some swelling of the area.  No fevers.  He was previously given a prescription for allopurinol but only took it for a month and did not have any refills.  He has been using ibuprofen for symptomatic relief without improvement in symptoms."   In ED patient given injection of toradol and 5 days taper prednisone.  Pt states now his toe pain is residual about 2/10. Mild stiffness.  Pt was on allopurinol in the past. He ran out and did not ask for refills.  2 gout flares in past.   Bp elevated in the ED and elevated intially today. No hx of high blood pressure. On second check better.   Discussion on obesity as well. Explained insulin resistance and effect on bp. Encouraged wt loss.  Review of Systems  Constitutional:  Negative for chills, fatigue and fever.  Respiratory:  Negative for chest tightness,  shortness of breath and wheezing.   Cardiovascular:  Negative for chest pain and palpitations.  Gastrointestinal:  Negative for abdominal pain.  Genitourinary:  Negative for dysuria and flank pain.  Musculoskeletal:        Toe pain.  Skin:  Negative for rash.  Neurological:  Negative for dizziness and headaches.  Psychiatric/Behavioral:  Negative for agitation and confusion.     No past medical history on file.   Social History   Socioeconomic History   Marital status: Married    Spouse name: Not on file   Number of children: Not on file   Years of education: Not on file   Highest education level: Not on file  Occupational History   Not on file  Tobacco Use   Smoking status: Former   Smokeless tobacco: Never  Vaping Use   Vaping Use: Never used  Substance and Sexual Activity   Alcohol use: Yes    Alcohol/week: 3.0 standard drinks of alcohol    Types: 3 Shots of liquor per week    Comment: sporatic use about every 3 months.   Drug use: Never   Sexual activity: Yes  Other Topics Concern   Not on file  Social History Narrative   Not on file   Social Determinants of  Health   Financial Resource Strain: Not on file  Food Insecurity: Not on file  Transportation Needs: Not on file  Physical Activity: Not on file  Stress: Not on file  Social Connections: Not on file  Intimate Partner Violence: Not on file    No past surgical history on file.  No family history on file.  Allergies  Allergen Reactions   Mixed Ragweed    Penicillins     Current Outpatient Medications on File Prior to Visit  Medication Sig Dispense Refill   allopurinol (ZYLOPRIM) 100 MG tablet Take 1 tablet (100 mg total) by mouth daily. 30 tablet 1   celecoxib (CELEBREX) 200 MG capsule Take 1 to 2 tablets by mouth daily as needed for pain. 60 capsule 2   methocarbamol (ROBAXIN) 500 MG tablet Take 1 tablet (500 mg total) by mouth every 8 (eight) hours as needed for muscle spasms. 45 tablet 1    predniSONE (DELTASONE) 20 MG tablet 3 tabs po day one, then 2 po daily x 4 days 11 tablet 0   No current facility-administered medications on file prior to visit.    BP 130/85   Pulse 63   Temp 98.5 F (36.9 C)   Resp 18   Ht 6\' 2"  (1.88 m)   Wt (!) 333 lb 12.8 oz (151.4 kg)   SpO2 99%   BMI 42.86 kg/m          Objective:   Physical Exam  General Mental Status- Alert. General Appearance- Not in acute distress.   Skin General: Color- Normal Color. Moisture- Normal Moisture.  Neck Carotid Arteries- Normal color. Moisture- Normal Moisture. No carotid bruits. No JVD.  Chest and Lung Exam Auscultation: Breath Sounds:-Normal.  Cardiovascular Auscultation:Rythm- Regular. Murmurs & Other Heart Sounds:Auscultation of the heart reveals- No Murmurs.  Abdomen Inspection:-Inspeection Normal. Palpation/Percussion:Note:No mass. Palpation and Percussion of the abdomen reveal- Non Tender, Non Distended + BS, no rebound or guarding.    Neurologic Cranial Nerve exam:- CN III-XII intact(No nystagmus), symmetric smile. Drift Test:- No drift. Romberg Exam:- Negative.  Heal to Toe Gait exam:-Normal. Finger to Nose:- Normal/Intact Strength:- 5/5 equal and symmetric strength both upper and lower extremities.    A/P   Patient Instructions  For you wellness exam today I have ordered cbc, cmp and  lipid panel. Will add uric acid today as well.  Vaccine up to date  Recommend exercise and healthy diet.  We will let you know lab results as they come in.  Follow up date appointment will be determined after lab review.    For gout follow low uric acid diet. Rx colichine 0.6 twice daily. Rx advisement given.  Elevated bp situational. One time in ED with pain and again initially here. But better on recheck. Check bp periodically at home and make sure bp less than 140/90    Discussion on obesity as well. Explained insulin resistance and effect on bp. Encouraged wt loss.  , Esperanza Richters   [place] charge today as did address gout ED follow up. Morbid obesity and situational high bp.

## 2022-05-14 NOTE — Patient Instructions (Addendum)
For you wellness exam today I have ordered cbc, cmp and  lipid panel. Will add uric acid today as well.  Vaccine up to date  Recommend exercise and healthy diet.  We will let you know lab results as they come in.  Follow up date appointment will be determined after lab review.    For gout follow low uric acid diet. Rx colichine 0.6 twice daily. Rx advisement given.  Elevated bp situational. One time in ED with pain and again initially here. But better on recheck. Check bp periodically at home and make sure bp less than 140/90  Discussion on obesity as well. Explained insulin resistance and effect on bp. Encouraged wt loss.  Preventive Care 4-11 Years Old, Male Preventive care refers to lifestyle choices and visits with your health care provider that can promote health and wellness. Preventive care visits are also called wellness exams. What can I expect for my preventive care visit? Counseling During your preventive care visit, your health care provider may ask about your: Medical history, including: Past medical problems. Family medical history. Current health, including: Emotional well-being. Home life and relationship well-being. Sexual activity. Lifestyle, including: Alcohol, nicotine or tobacco, and drug use. Access to firearms. Diet, exercise, and sleep habits. Safety issues such as seatbelt and bike helmet use. Sunscreen use. Work and work Astronomer. Physical exam Your health care provider will check your: Height and weight. These may be used to calculate your BMI (body mass index). BMI is a measurement that tells if you are at a healthy weight. Waist circumference. This measures the distance around your waistline. This measurement also tells if you are at a healthy weight and may help predict your risk of certain diseases, such as type 2 diabetes and high blood pressure. Heart rate and blood pressure. Body temperature. Skin for abnormal spots. What immunizations  do I need?  Vaccines are usually given at various ages, according to a schedule. Your health care provider will recommend vaccines for you based on your age, medical history, and lifestyle or other factors, such as travel or where you work. What tests do I need? Screening Your health care provider may recommend screening tests for certain conditions. This may include: Lipid and cholesterol levels. Diabetes screening. This is done by checking your blood sugar (glucose) after you have not eaten for a while (fasting). Hepatitis B test. Hepatitis C test. HIV (human immunodeficiency virus) test. STI (sexually transmitted infection) testing, if you are at risk. Lung cancer screening. Prostate cancer screening. Colorectal cancer screening. Talk with your health care provider about your test results, treatment options, and if necessary, the need for more tests. Follow these instructions at home: Eating and drinking  Eat a diet that includes fresh fruits and vegetables, whole grains, lean protein, and low-fat dairy products. Take vitamin and mineral supplements as recommended by your health care provider. Do not drink alcohol if your health care provider tells you not to drink. If you drink alcohol: Limit how much you have to 0-2 drinks a day. Know how much alcohol is in your drink. In the [place] one drink equals one 12 oz bottle of beer (355 mL), one 5 oz glass of wine (148 mL), or one 1 oz glass of hard liquor (44 mL). Lifestyle Brush your teeth every morning and night with fluoride toothpaste. Floss one time each day. Exercise for at least 30 minutes 5 or more days each week. Do not use any products that contain nicotine or tobacco. These products include  cigarettes, chewing tobacco, and vaping devices, such as e-cigarettes. If you need help quitting, ask your health care provider. Do not use drugs. If you are sexually active, practice safe sex. Use a condom or other form of protection to  prevent STIs. Take aspirin only as told by your health care provider. Make sure that you understand how much to take and what form to take. Work with your health care provider to find out whether it is safe and beneficial for you to take aspirin daily. Find healthy ways to manage stress, such as: Meditation, yoga, or listening to music. Journaling. Talking to a trusted person. Spending time with friends and family. Minimize exposure to UV radiation to reduce your risk of skin cancer. Safety Always wear your seat belt while driving or riding in a vehicle. Do not drive: If you have been drinking alcohol. Do not ride with someone who has been drinking. When you are tired or distracted. While texting. If you have been using any mind-altering substances or drugs. Wear a helmet and other protective equipment during sports activities. If you have firearms in your house, make sure you follow all gun safety procedures. What's next? Go to your health care provider once a year for an annual wellness visit. Ask your health care provider how often you should have your eyes and teeth checked. Stay up to date on all vaccines. This information is not intended to replace advice given to you by your health care provider. Make sure you discuss any questions you have with your health care provider. Document Revised: 05/13/2021 Document Reviewed: 05/13/2021 Elsevier Patient Education  2023 [employer].

## 2022-06-15 ENCOUNTER — Encounter: Payer: No Typology Code available for payment source | Admitting: Medical

## 2023-03-14 ENCOUNTER — Encounter: Payer: Self-pay | Admitting: *Deleted

## 2024-04-06 ENCOUNTER — Other Ambulatory Visit: Payer: Self-pay | Admitting: Medical

## 2024-04-06 NOTE — Telephone Encounter (Signed)
 Copied from CRM [id number]. Topic: Clinical - Medication Refill >> Apr 06, 2024 11:43 AM Adonis Hoot wrote: Medication: colchicine  0.6 MG tablet  Has the patient contacted their pharmacy? No (Agent: If no, request that the patient contact the pharmacy for the refill. If patient does not wish to contact the pharmacy document the reason why and proceed with request.) (Agent: If yes, when and what did the pharmacy advise?)  This is the patient's preferred pharmacy:    [id number] - [place], [place] - [address] AT [facility] [address] [place] [address] Phone: [phone] Fax: [phone]  Is this the correct pharmacy for this prescription? Yes If no, delete pharmacy and type the correct one.   Has the prescription been filled recently? No  Is the patient out of the medication? Yes  Has the patient been seen for an appointment in the last year OR does the patient have an upcoming appointment? No  Can we respond through MyChart? No  Agent: Please be advised that Rx refills may take up to 3 business days. We ask that you follow-up with your pharmacy.
